# Patient Record
Sex: Male | Born: 1985 | Race: White | Hispanic: No | Marital: Single | Smoking: Current every day smoker
Health system: Southern US, Community
[De-identification: ages and names within clinical notes are randomized; demographics above are authoritative.]

## PROBLEM LIST (undated history)

## (undated) DIAGNOSIS — T07XXXA Unspecified multiple injuries, initial encounter: Secondary | ICD-10-CM

## (undated) DIAGNOSIS — T1490XA Injury, unspecified, initial encounter: Secondary | ICD-10-CM

## (undated) HISTORY — PX: FRACTURE SURGERY: SHX138

---

## 2006-04-21 ENCOUNTER — Emergency Department: Payer: Self-pay | Admitting: General Practice

## 2007-06-18 ENCOUNTER — Inpatient Hospital Stay (HOSPITAL_COMMUNITY): Admission: EM | Admit: 2007-06-18 | Discharge: 2007-06-22 | Payer: Self-pay | Admitting: Emergency Medicine

## 2007-07-17 ENCOUNTER — Emergency Department (HOSPITAL_COMMUNITY): Admission: EM | Admit: 2007-07-17 | Discharge: 2007-07-18 | Payer: Self-pay | Admitting: Emergency Medicine

## 2008-08-28 ENCOUNTER — Emergency Department: Payer: Self-pay | Admitting: Emergency Medicine

## 2008-09-05 ENCOUNTER — Emergency Department: Payer: Self-pay | Admitting: Emergency Medicine

## 2008-10-08 ENCOUNTER — Emergency Department: Payer: Self-pay | Admitting: Emergency Medicine

## 2009-04-12 ENCOUNTER — Emergency Department: Payer: Self-pay | Admitting: Emergency Medicine

## 2009-09-10 ENCOUNTER — Emergency Department: Payer: Self-pay | Admitting: Internal Medicine

## 2009-09-11 ENCOUNTER — Emergency Department: Payer: Self-pay | Admitting: Emergency Medicine

## 2009-09-25 ENCOUNTER — Emergency Department (HOSPITAL_COMMUNITY): Admission: EM | Admit: 2009-09-25 | Discharge: 2009-09-25 | Payer: Self-pay | Admitting: Emergency Medicine

## 2009-09-27 ENCOUNTER — Inpatient Hospital Stay: Payer: Self-pay | Admitting: Psychiatry

## 2009-11-07 ENCOUNTER — Emergency Department (HOSPITAL_COMMUNITY): Admission: EM | Admit: 2009-11-07 | Discharge: 2009-11-07 | Payer: Self-pay | Admitting: Emergency Medicine

## 2009-11-07 ENCOUNTER — Emergency Department: Payer: Self-pay | Admitting: Emergency Medicine

## 2010-02-03 ENCOUNTER — Emergency Department (HOSPITAL_COMMUNITY): Admission: EM | Admit: 2010-02-03 | Discharge: 2010-02-03 | Payer: Self-pay | Admitting: Emergency Medicine

## 2010-06-05 ENCOUNTER — Emergency Department: Payer: Self-pay | Admitting: Emergency Medicine

## 2011-02-01 LAB — DIFFERENTIAL
Basophils Relative: 0 % (ref 0–1)
Eosinophils Absolute: 0.3 10*3/uL (ref 0.0–0.7)
Eosinophils Relative: 5 % (ref 0–5)
Lymphs Abs: 1.4 10*3/uL (ref 0.7–4.0)
Monocytes Relative: 7 % (ref 3–12)
Neutro Abs: 3.8 10*3/uL (ref 1.7–7.7)
Neutrophils Relative %: 63 % (ref 43–77)

## 2011-02-01 LAB — URINALYSIS, ROUTINE W REFLEX MICROSCOPIC
Glucose, UA: NEGATIVE mg/dL
Nitrite: NEGATIVE
Urobilinogen, UA: 1 mg/dL (ref 0.0–1.0)

## 2011-02-01 LAB — POCT I-STAT, CHEM 8
Creatinine, Ser: 1.1 mg/dL (ref 0.4–1.5)
Hemoglobin: 14.6 g/dL (ref 13.0–17.0)
Potassium: 3.7 mEq/L (ref 3.5–5.1)
TCO2: 27 mmol/L (ref 0–100)

## 2011-02-01 LAB — URINE MICROSCOPIC-ADD ON

## 2011-02-01 LAB — CBC: MCV: 87.9 fL (ref 78.0–100.0)

## 2011-02-11 LAB — URINALYSIS, ROUTINE W REFLEX MICROSCOPIC
Bilirubin Urine: NEGATIVE
Glucose, UA: NEGATIVE mg/dL
Ketones, ur: NEGATIVE mg/dL
Nitrite: NEGATIVE
Specific Gravity, Urine: 1.024 (ref 1.005–1.030)
pH: 6 (ref 5.0–8.0)

## 2011-02-11 LAB — URINE MICROSCOPIC-ADD ON
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
## 2011-08-24 LAB — CBC
HCT: 35.1 — ABNORMAL LOW
HCT: 43.9
Hemoglobin: 12.5 — ABNORMAL LOW
Hemoglobin: 12.6 — ABNORMAL LOW
Hemoglobin: 15.2
MCHC: 34.4
MCHC: 35.5
MCV: 85
RBC: 4.13 — ABNORMAL LOW
RBC: 4.19 — ABNORMAL LOW
RBC: 5.07
RDW: 13.3
WBC: 9.6
WBC: 9.8

## 2011-08-24 LAB — I-STAT 8, (EC8 V) (CONVERTED LAB)
Acid-base deficit: 2
Glucose, Bld: 111 — ABNORMAL HIGH
HCT: 47
Hemoglobin: 16
Potassium: 3 — ABNORMAL LOW
Sodium: 138
TCO2: 24
pH, Ven: 7.382 — ABNORMAL HIGH

## 2011-08-24 LAB — BASIC METABOLIC PANEL
CO2: 30
Calcium: 8.6
Chloride: 105
Creatinine, Ser: 0.94
GFR calc Af Amer: >60
Sodium: 140

## 2011-08-24 LAB — PROTIME-INR: Prothrombin Time: 15.8 — ABNORMAL HIGH

## 2011-08-24 LAB — SAMPLE TO BLOOD BANK

## 2014-10-03 ENCOUNTER — Emergency Department (HOSPITAL_COMMUNITY)
Admission: EM | Admit: 2014-10-03 | Discharge: 2014-10-03 | Disposition: A | Discharge status: Home / Self Care | Payer: Self-pay | Attending: Emergency Medicine | Admitting: Emergency Medicine

## 2014-10-03 ENCOUNTER — Emergency Department (HOSPITAL_COMMUNITY): Payer: Self-pay

## 2014-10-03 ENCOUNTER — Encounter (HOSPITAL_COMMUNITY): Payer: Self-pay | Admitting: Cardiology

## 2014-10-03 DIAGNOSIS — Z87891 Personal history of nicotine dependence: Secondary | ICD-10-CM | POA: Insufficient documentation

## 2014-10-03 DIAGNOSIS — T1490XA Injury, unspecified, initial encounter: Secondary | ICD-10-CM

## 2014-10-03 DIAGNOSIS — M455 Ankylosing spondylitis of thoracolumbar region: Secondary | ICD-10-CM | POA: Insufficient documentation

## 2014-10-03 DIAGNOSIS — Z87828 Personal history of other (healed) physical injury and trauma: Secondary | ICD-10-CM | POA: Insufficient documentation

## 2014-10-03 DIAGNOSIS — S22000S Wedge compression fracture of unspecified thoracic vertebra, sequela: Secondary | ICD-10-CM

## 2014-10-03 DIAGNOSIS — Z8781 Personal history of (healed) traumatic fracture: Secondary | ICD-10-CM | POA: Insufficient documentation

## 2014-10-03 DIAGNOSIS — M47819 Spondylosis without myelopathy or radiculopathy, site unspecified: Secondary | ICD-10-CM

## 2014-10-03 DIAGNOSIS — X58XXXS Exposure to other specified factors, sequela: Secondary | ICD-10-CM | POA: Insufficient documentation

## 2014-10-03 DIAGNOSIS — T148XXA Other injury of unspecified body region, initial encounter: Secondary | ICD-10-CM

## 2014-10-03 DIAGNOSIS — Z880 Allergy status to penicillin: Secondary | ICD-10-CM | POA: Insufficient documentation

## 2014-10-03 DIAGNOSIS — M549 Dorsalgia, unspecified: Secondary | ICD-10-CM

## 2014-10-03 DIAGNOSIS — S32010S Wedge compression fracture of first lumbar vertebra, sequela: Secondary | ICD-10-CM | POA: Insufficient documentation

## 2014-10-03 DIAGNOSIS — S22080S Wedge compression fracture of T11-T12 vertebra, sequela: Secondary | ICD-10-CM | POA: Insufficient documentation

## 2014-10-03 HISTORY — DX: Injury, unspecified, initial encounter: T14.90XA

## 2014-10-03 HISTORY — DX: Unspecified multiple injuries, initial encounter: T07.XXXA

## 2014-10-03 MED ORDER — DIAZEPAM 5 MG PO TABS
10.0000 mg | ORAL_TABLET | Freq: Once | ORAL | Status: AC
Start: 2014-10-03 — End: 2014-10-03
  Administered 2014-10-03: 10 mg via ORAL
  Filled 2014-10-03: qty 2

## 2014-10-03 MED ORDER — IBUPROFEN 800 MG PO TABS
800.0000 mg | ORAL_TABLET | Freq: Three times a day (TID) | ORAL | Status: AC
Start: 2014-10-03 — End: ?

## 2014-10-03 MED ORDER — IBUPROFEN 800 MG PO TABS
800.0000 mg | ORAL_TABLET | Freq: Once | ORAL | Status: AC
  Administered 2014-10-03: 800 mg via ORAL
  Filled 2014-10-03: qty 1

## 2014-10-03 MED ORDER — METHOCARBAMOL 500 MG PO TABS: 500.0000 mg | ORAL_TABLET | Freq: Three times a day (TID) | ORAL | Status: AC

## 2014-10-03 NOTE — Discharge Instructions (Signed)
Your x-ray is negative for any new fracture or dislocation. Your previous fractures are seen on today's x-ray, there is also noted multiple areas of arthritis. Please discuss these findings with [facility]. There may be a need for an MRI. Please use ibuprofen and Robaxin 3 times daily for your discomfort. Heating pad to the area may be helpful. Robaxin may cause drowsiness, please use with caution.

## 2014-10-03 NOTE — ED Notes (Signed)
Mid back pain times 3 days.  States it started when he was helping load wood.  States his right hand feels numb.
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
## 2017-07-08 ENCOUNTER — Emergency Department
Admission: EM | Admit: 2017-07-08 | Discharge: 2017-07-08 | Disposition: A | Discharge status: Home / Self Care | Payer: Self-pay | Attending: Emergency Medicine | Admitting: Emergency Medicine

## 2017-07-08 ENCOUNTER — Encounter: Payer: Self-pay | Admitting: Emergency Medicine

## 2017-07-08 ENCOUNTER — Emergency Department: Payer: Self-pay

## 2017-07-08 DIAGNOSIS — Y929 Unspecified place or not applicable: Secondary | ICD-10-CM | POA: Insufficient documentation

## 2017-07-08 DIAGNOSIS — Y999 Unspecified external cause status: Secondary | ICD-10-CM | POA: Insufficient documentation

## 2017-07-08 DIAGNOSIS — R112 Nausea with vomiting, unspecified: Secondary | ICD-10-CM | POA: Insufficient documentation

## 2017-07-08 DIAGNOSIS — S76211A Strain of adductor muscle, fascia and tendon of right thigh, initial encounter: Secondary | ICD-10-CM | POA: Insufficient documentation

## 2017-07-08 DIAGNOSIS — Z87891 Personal history of nicotine dependence: Secondary | ICD-10-CM | POA: Insufficient documentation

## 2017-07-08 DIAGNOSIS — Y939 Activity, unspecified: Secondary | ICD-10-CM | POA: Insufficient documentation

## 2017-07-08 DIAGNOSIS — X509XXA Other and unspecified overexertion or strenuous movements or postures, initial encounter: Secondary | ICD-10-CM | POA: Insufficient documentation

## 2017-07-08 DIAGNOSIS — Z79899 Other long term (current) drug therapy: Secondary | ICD-10-CM | POA: Insufficient documentation

## 2017-07-08 DIAGNOSIS — R109 Unspecified abdominal pain: Secondary | ICD-10-CM | POA: Insufficient documentation

## 2017-07-08 LAB — URINALYSIS, COMPLETE (UACMP) WITH MICROSCOPIC
BILIRUBIN URINE: NEGATIVE
Bacteria, UA: NONE SEEN
GLUCOSE, UA: NEGATIVE mg/dL
KETONES UR: NEGATIVE mg/dL
LEUKOCYTES UA: NEGATIVE
NITRITE: NEGATIVE
PH: 7 (ref 5.0–8.0)
Protein, ur: NEGATIVE mg/dL
SPECIFIC GRAVITY, URINE: 1.017 (ref 1.005–1.030)
WBC, UA: NONE SEEN WBC/hpf (ref 0–5)

## 2017-07-08 MED ORDER — CYCLOBENZAPRINE HCL 10 MG PO TABS: 10.0000 mg | ORAL_TABLET | Freq: Three times a day (TID) | ORAL | 0 refills | Status: AC | PRN

## 2017-07-08 MED ORDER — KETOROLAC TROMETHAMINE 30 MG/ML IJ SOLN
30.0000 mg | Freq: Once | INTRAMUSCULAR | Status: AC
  Administered 2017-07-08: 30 mg via INTRAMUSCULAR
  Filled 2017-07-08: qty 1

## 2017-07-08 MED ORDER — TRAMADOL HCL 50 MG PO TABS: 50.0000 mg | ORAL_TABLET | Freq: Four times a day (QID) | ORAL | 0 refills | Status: AC | PRN

## 2017-07-08 MED ORDER — ONDANSETRON 8 MG PO TBDP
8.0000 mg | ORAL_TABLET | Freq: Once | ORAL | Status: AC
  Administered 2017-07-08: 8 mg via ORAL
  Filled 2017-07-08: qty 1

## 2017-07-08 MED ORDER — IBUPROFEN 800 MG PO TABS: 800.0000 mg | ORAL_TABLET | Freq: Three times a day (TID) | ORAL | 0 refills | Status: AC | PRN

## 2017-07-08 MED ORDER — HYDROMORPHONE HCL 1 MG/ML IJ SOLN
1.0000 mg | Freq: Once | INTRAMUSCULAR | Status: AC
Start: 2017-07-08 — End: 2017-07-08
  Administered 2017-07-08: 1 mg via INTRAMUSCULAR
  Filled 2017-07-08: qty 1

## 2017-07-08 NOTE — ED Provider Notes (Signed)
[facility] Emergency Department Provider Note   ____________________________________________   First MD Initiated Contact with Patient 07/08/17 [phone]     (approximate)  I have reviewed the triage vital signs and the nursing notes.   HISTORY  Chief Complaint Back Pain    HPI [name] is a 31 y.o. male patient complaining of increasing right flank pain for 3 days. Patient stated associated nausea with complaint. Patient denied dysuria or hematuria. Patient also complaining of right inguinal pain that radiates to his scrotum.Patient rates the pain as 8/10. Patient described a pain as "crampy/sharp. No palliative measures for complaint.   Past Medical History:  Diagnosis Date  . Fractures   . Trauma     There are no active problems to display for this patient.   No past surgical history on file.  Prior to Admission medications   Medication Sig Start Date End Date Taking? Authorizing Provider  cyclobenzaprine (FLEXERIL) 10 MG tablet Take 1 tablet (10 mg total) by mouth 3 (three) times daily as needed. 07/08/17   Joni Reining, PA-C  ibuprofen (ADVIL,MOTRIN) 800 MG tablet Take 1 tablet (800 mg total) by mouth 3 (three) times daily. 10/03/14   Ivery Quale, PA-C  ibuprofen (ADVIL,MOTRIN) 800 MG tablet Take 1 tablet (800 mg total) by mouth every 8 (eight) hours as needed for moderate pain. 07/08/17   Joni Reining, PA-C  methocarbamol (ROBAXIN) 500 MG tablet Take 1 tablet (500 mg total) by mouth 3 (three) times daily. 10/03/14   Ivery Quale, PA-C  naproxen sodium (ALEVE) 220 MG tablet Take 440 mg by mouth daily as needed (for pain).    [provider]  traMADol (ULTRAM) 50 MG tablet Take 1 tablet (50 mg total) by mouth every 6 (six) hours as needed for moderate pain. 07/08/17   Joni Reining, PA-C    Allergies Penicillins  No family history on file.  Social History Social History  Substance Use Topics  . Smoking status: Former  Games developer  . Smokeless tobacco: Not on file  . Alcohol use No    Review of Systems Constitutional: No fever/chills Eyes: No visual changes. ENT: No sore throat. Cardiovascular: Denies chest pain. Respiratory: Denies shortness of breath. Gastrointestinal: No abdominal pain.  No nausea, no vomiting.  No diarrhea.  No constipation. Genitourinary: Negative for dysuria. Musculoskeletal: Right flank pain Skin: Negative for rash. Neurological: Negative for headaches, focal weakness or numbness. Allergic/Immunilogical: Penicillin ____________________________________________   PHYSICAL EXAM:  VITAL SIGNS: ED Triage Vitals  Enc Vitals Group     BP 07/08/17 1606 134/76     Pulse Rate 07/08/17 1606 64     Resp 07/08/17 1606 18     Temp 07/08/17 1606 98.6 F (37 C)     Temp Source 07/08/17 1606 Oral     SpO2 07/08/17 1606 100 %     Weight 07/08/17 1605 238 lb (108 kg)     Height 07/08/17 1605 6\' 1"  (1.854 m)     Head Circumference --      Peak Flow --      Pain Score 07/08/17 1605 8     Pain Loc --      Pain Edu? --      Excl. in GC? --     Constitutional: Alert and oriented. Well appearing and in no acute distress. Cardiovascular: Normal rate, regular rhythm. Grossly normal heart sounds.  Good peripheral circulation. Respiratory: Normal respiratory effort.  No retractions. Lungs CTAB. Gastrointestinal: Soft and  nontender. No distention. No abdominal bruits. No CVA tenderness. Musculoskeletal: No lower extremity tenderness nor edema.  No joint effusions. Neurologic:  Normal speech and language. No gross focal neurologic deficits are appreciated. No gait instability. Skin:  Skin is warm, dry and intact. No rash noted. Psychiatric: Mood and affect are normal. Speech and behavior are normal.  ____________________________________________   LABS (all labs ordered are listed, but only abnormal results are displayed)  Labs Reviewed  URINALYSIS, COMPLETE (UACMP) WITH MICROSCOPIC -  Abnormal; Notable for the following:       Result Value   Color, Urine YELLOW (*)    APPearance CLEAR (*)    Hgb urine dipstick MODERATE (*)    Squamous Epithelial / LPF 0-5 (*)    All other components within normal limits   ____________________________________________  EKG   ____________________________________________  RADIOLOGY  Ct Renal Stone Study  Result Date: 07/08/2017 CLINICAL DATA:  Onset of RIGHT sided back pain 3 days ago, difficulty passing urine, pain moving to groin, nausea, history of kidney stones over 10 years ago EXAM: CT ABDOMEN AND PELVIS WITHOUT CONTRAST TECHNIQUE: Multidetector CT imaging of the abdomen and pelvis was performed following the standard protocol without IV contrast. Sagittal and coronal MPR images reconstructed from axial data set. Oral contrast was not administered. COMPARISON:  09/10/2009 CT abdomen and pelvis, lumbar spine radiographs 10/03/2014 FINDINGS: Lower chest: Minimal dependent atelectasis at lung bases Hepatobiliary: Gallbladder and liver normal appearance Pancreas: Normal appearance Spleen: Upper normal size without focal abnormality Adrenals/Urinary Tract: Adrenal glands normal appearance. Kidneys, ureters, and bladder normal appearance. No urinary tract calcification or dilatation. Stomach/Bowel: Normal appearing retrocecal appendix. Stomach and bowel loops grossly normal appearance for technique. Vascular/Lymphatic: Aorta normal caliber.  No adenopathy. Reproductive: Unremarkable prostate gland and seminal vesicles Other: No free air or free fluid. No hernia or inflammatory process. Musculoskeletal: Chronic superior endplate compression fractures of T12 and L1 unchanged. No acute osseous findings. IMPRESSION: No acute intra-abdominal or intrapelvic abnormalities. 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 Electronically Signed   By: Ulyses SouthwardMark  Boles M.D.   On: 07/08/2017 17:50     ____________________________________________   PROCEDURES  Procedure(s) performed: None  Procedures  Critical Care performed: No  ____________________________________________   INITIAL IMPRESSION / ASSESSMENT AND PLAN / ED COURSE  Pertinent labs & imaging results that were available during my care of the patient were reviewed by me and considered in my medical decision making (see chart for details). Right flank and right inguinal pain. Patient present with right flank and inguinal pain for 3 days. Patient's abdominal and pelvic x-ray were unremarkable. Patient given discharge care instruction advised to take medication as directed. Patient given a work note and [facility] if complaint persists.      ____________________________________________   FINAL CLINICAL IMPRESSION(S) / ED DIAGNOSES  Final diagnoses:  Right flank pain  Inguinal strain, right, initial encounter      NEW MEDICATIONS STARTED DURING THIS VISIT:  Discharge Medication List as of 07/08/2017  6:09 PM    START taking these medications   Details  cyclobenzaprine (FLEXERIL) 10 MG tablet Take 1 tablet (10 mg total) by mouth 3 (three) times daily as needed., Starting Thu 07/08/2017, Print    !! ibuprofen (ADVIL,MOTRIN) 800 MG tablet Take 1 tablet (800 mg total) by mouth every 8 (eight) hours as needed for moderate pain., Starting Thu 07/08/2017, Print    traMADol (ULTRAM) 50 MG tablet Take 1 tablet (  50 mg total) by mouth every 6 (six) hours as needed for moderate pain., Starting Thu 07/08/2017, Print     !! - Potential duplicate medications found. Please discuss with provider.       Note:  This document was prepared using Dragon voice recognition software and may include unintentional dictation errors.    Joni Reining, PA-C 07/08/17 2133    Nita Sickle, MD 07/09/17 [phone]

## 2017-07-08 NOTE — ED Triage Notes (Signed)
Pt reports low back pain for three days. Pt reports some associated nausea but denies other symptoms. Pt ambulatory to triage. No apparent distress noted.

## 2017-07-08 NOTE — ED Notes (Addendum)
See triage note  States he developed right sided back pain about 3 days ago  Then also noticed some diff passing urine and pain is moving into groin area

## 2017-07-08 NOTE — Discharge Instructions (Signed)
Take medication as directed. Follow[facility] condition persists.

## 2018-04-08 IMAGING — CT CT RENAL STONE PROTOCOL
2 of 4 series · 16 of 46 positions shown, 18 images · non-contrast
Comparison: 09/10/2009 CT abdomen and pelvis, lumbar spine
radiographs 10/03/2014

CLINICAL DATA: Onset of RIGHT sided back pain 3 days ago,
difficulty passing urine, pain moving to groin, nausea, history of
kidney stones over 10 years ago

EXAM:
CT ABDOMEN AND PELVIS WITHOUT CONTRAST
TECHNIQUE: Multidetector CT imaging of the abdomen and pelvis was performed
following the standard protocol without IV contrast. Sagittal and
coronal MPR images reconstructed from axial data set. Oral contrast
was not administered.

[Series 2: stone full standard · axial · 0.84mm/px · z∈[-1120,-620]mm · 13 of 110 slices shown, 15 images]
[im 5/110  soft-tissue]
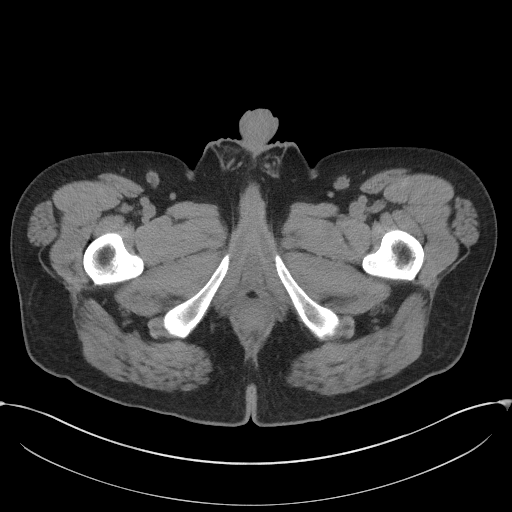
[im 5/110  bone]
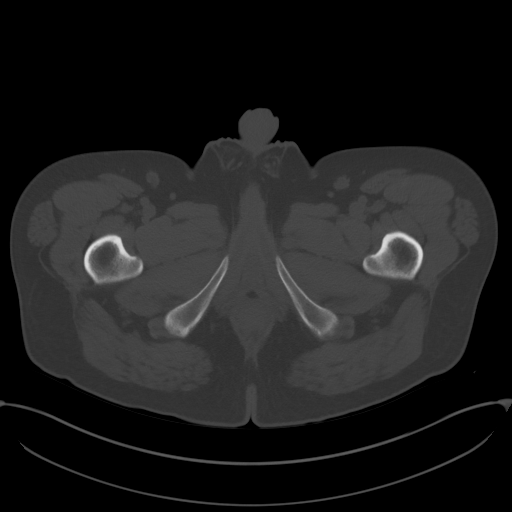
[im 14/110  soft-tissue]
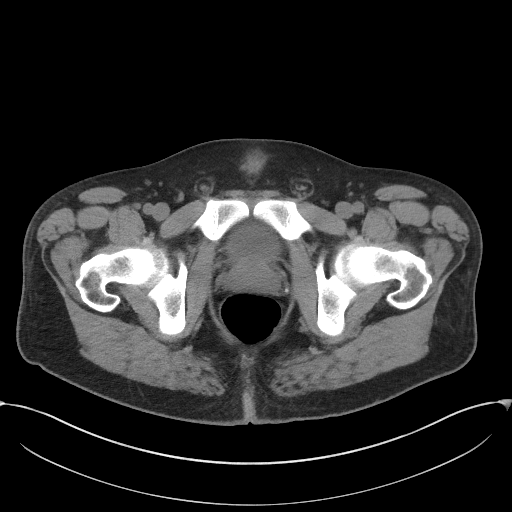
[im 22/110  soft-tissue]
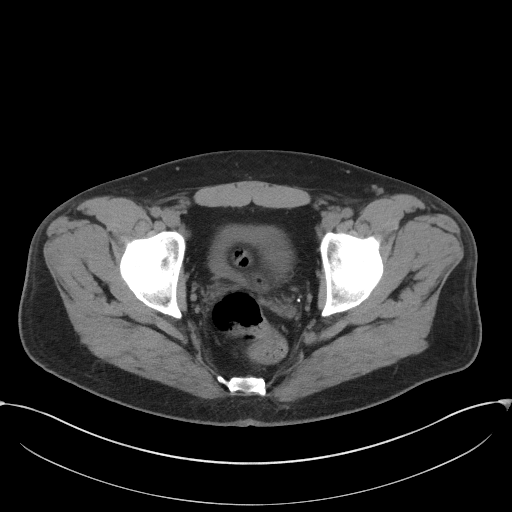
[im 31/110  soft-tissue]
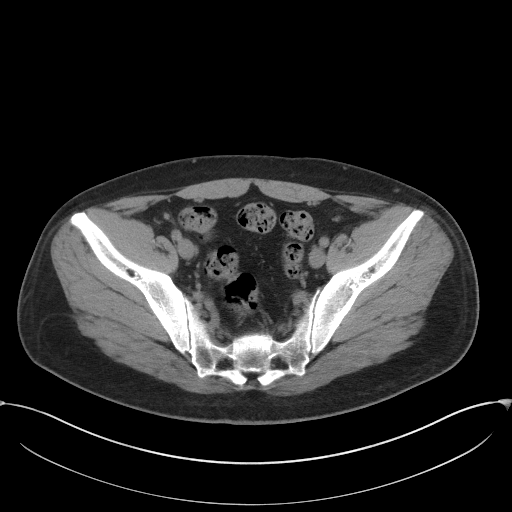
[im 40/110  soft-tissue]
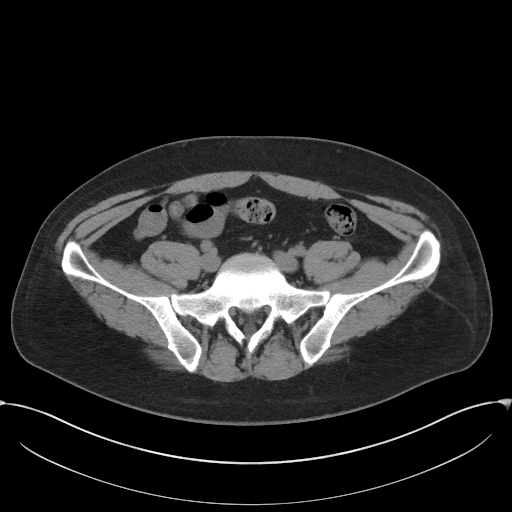
[im 48/110  soft-tissue]
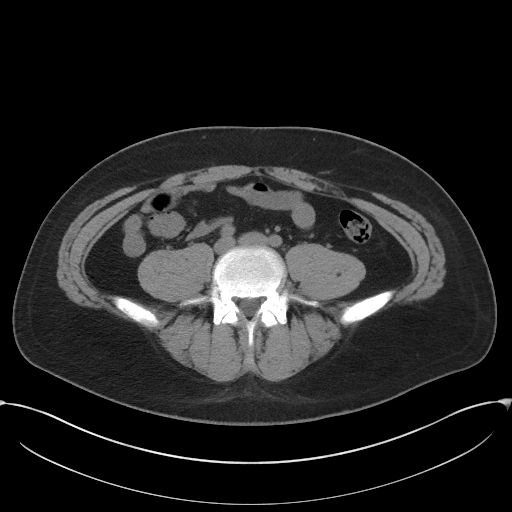
[im 57/110  soft-tissue]
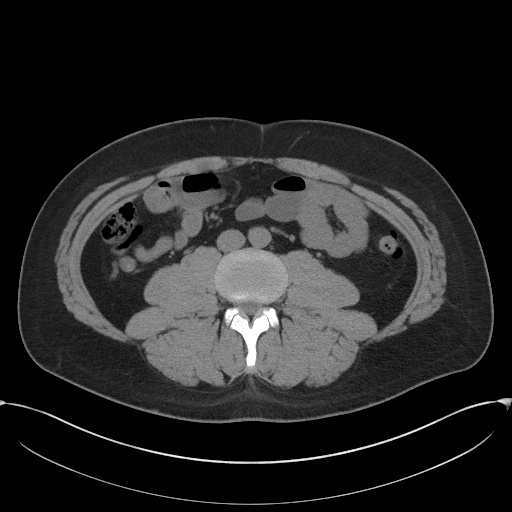
[im 62/110  soft-tissue]
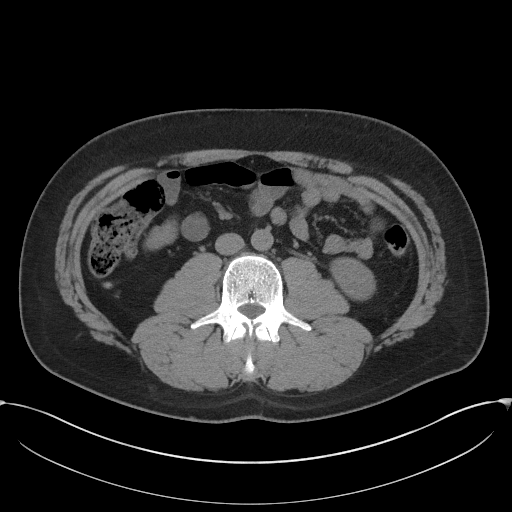
[im 70/110  soft-tissue]
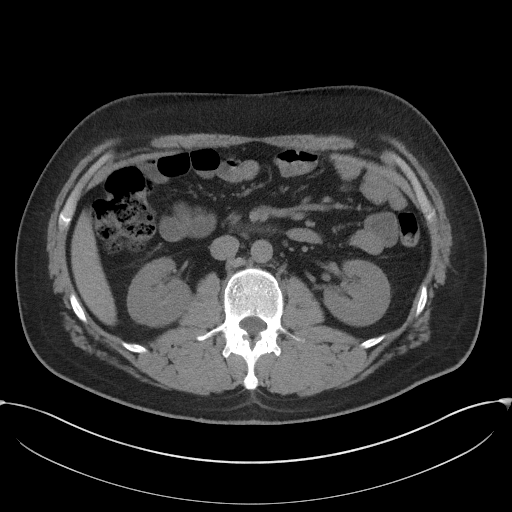
[im 70/110  bone]
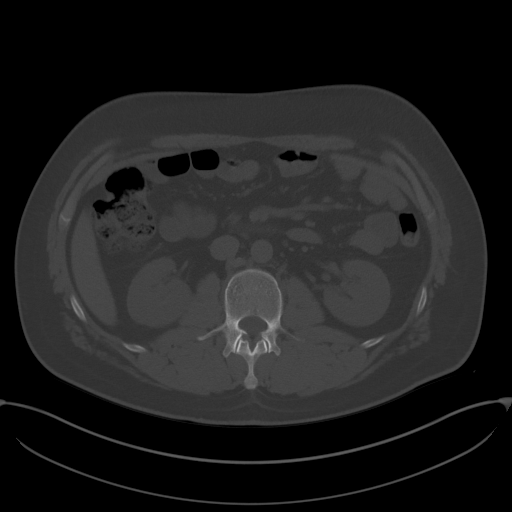
[im 79/110  soft-tissue]
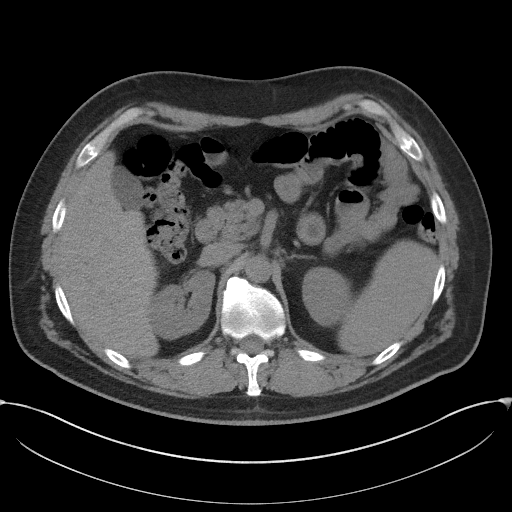
[im 88/110  soft-tissue]
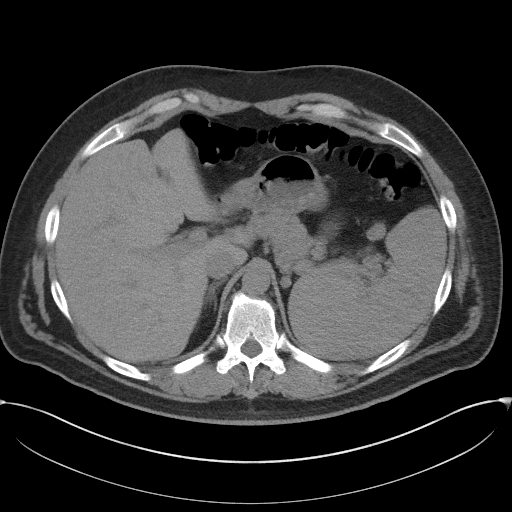
[im 96/110  soft-tissue]
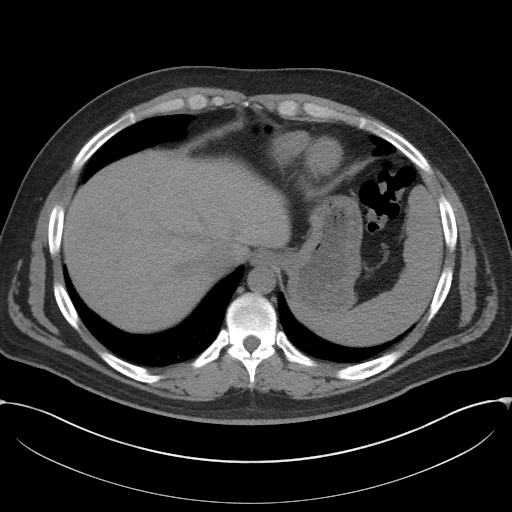
[im 105/110  soft-tissue]
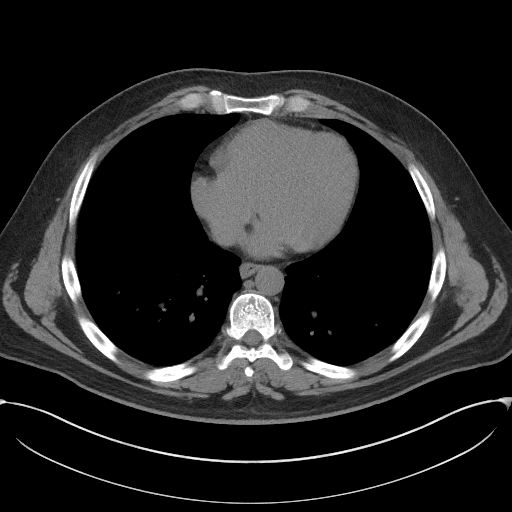

[Series 5: coronal · coronal · 0.92mm/px · 3 of 135 slices shown]
[im 45/135  soft-tissue]
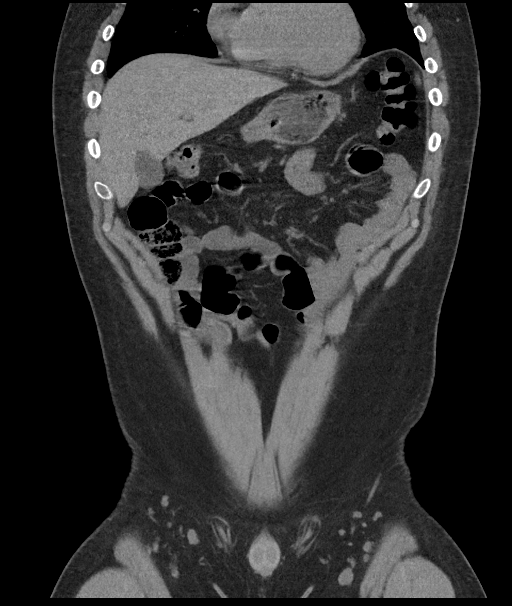
[im 60/135  soft-tissue]
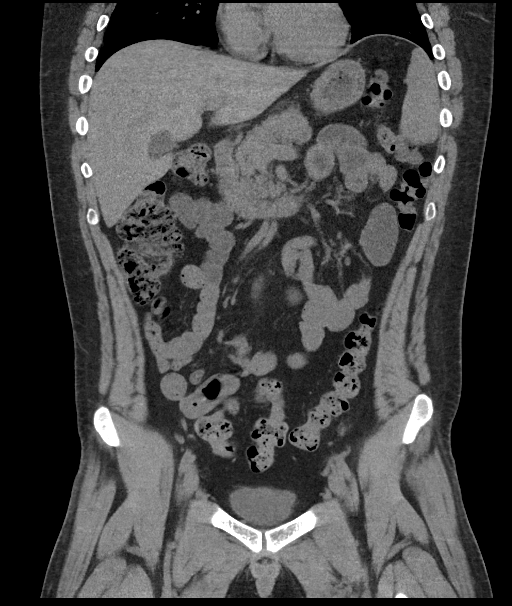
[im 75/135  soft-tissue]
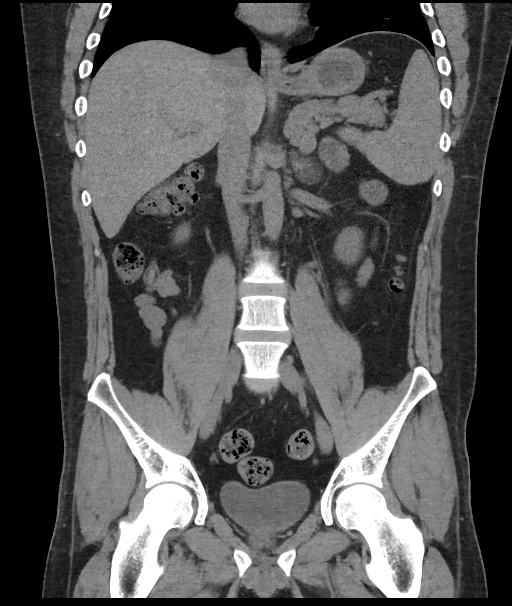

[16 of 46 positions shown; findings below may reference images not displayed]

FINDINGS: Lower chest: Minimal dependent atelectasis at lung bases

Hepatobiliary: Gallbladder and liver normal appearance

Pancreas: Normal appearance

Spleen: Upper normal size without focal abnormality

Adrenals/Urinary Tract: Adrenal glands normal appearance. Kidneys,
ureters, and bladder normal appearance. No urinary tract
calcification or dilatation.

Stomach/Bowel: Normal appearing retrocecal appendix. Stomach and
bowel loops grossly normal appearance for technique.

Vascular/Lymphatic: Aorta normal caliber.  No adenopathy.

Reproductive: Unremarkable prostate gland and seminal vesicles

Other: No free air or free fluid. No hernia or inflammatory process.

Musculoskeletal: Chronic superior endplate compression fractures of
T12 and L1 unchanged. No acute osseous findings.
IMPRESSION: No acute intra-abdominal or intrapelvic abnormalities.

Chronic superior endplate compression deformities of T12 and L1
vertebral bodies.

## 2019-02-18 ENCOUNTER — Emergency Department (HOSPITAL_COMMUNITY)
Admission: EM | Admit: 2019-02-18 | Discharge: 2019-02-19 | Disposition: A | Discharge status: Home / Self Care | Payer: Self-pay | Attending: Emergency Medicine | Admitting: Emergency Medicine

## 2019-02-18 ENCOUNTER — Other Ambulatory Visit: Payer: Self-pay

## 2019-02-18 ENCOUNTER — Encounter (HOSPITAL_COMMUNITY): Payer: Self-pay | Admitting: Emergency Medicine

## 2019-02-18 ENCOUNTER — Emergency Department (HOSPITAL_COMMUNITY): Payer: Self-pay

## 2019-02-18 DIAGNOSIS — F17210 Nicotine dependence, cigarettes, uncomplicated: Secondary | ICD-10-CM | POA: Insufficient documentation

## 2019-02-18 DIAGNOSIS — L03811 Cellulitis of head [any part, except face]: Secondary | ICD-10-CM | POA: Insufficient documentation

## 2019-02-18 DIAGNOSIS — R55 Syncope and collapse: Secondary | ICD-10-CM | POA: Insufficient documentation

## 2019-02-18 DIAGNOSIS — Z880 Allergy status to penicillin: Secondary | ICD-10-CM | POA: Insufficient documentation

## 2019-02-18 MED ORDER — IOHEXOL 300 MG/ML  SOLN
75.0000 mL | Freq: Once | INTRAMUSCULAR | Status: AC | PRN
  Administered 2019-02-19: 01:00:00 75 mL via INTRAVENOUS

## 2019-02-18 MED ORDER — SODIUM CHLORIDE 0.9 % IV BOLUS
1000.0000 mL | Freq: Once | INTRAVENOUS | Status: AC
  Administered 2019-02-18: 1000 mL via INTRAVENOUS

## 2019-02-18 MED ORDER — LIDOCAINE-EPINEPHRINE (PF) 1 %-1:200000 IJ SOLN
INTRAMUSCULAR | Status: AC
  Filled 2019-02-18: qty 30

## 2019-02-18 MED ORDER — LIDOCAINE-EPINEPHRINE (PF) 1 %-1:200000 IJ SOLN
10.0000 mL | Freq: Once | INTRAMUSCULAR | Status: AC
  Administered 2019-02-18: 10 mL via INTRADERMAL
  Filled 2019-02-18: qty 30

## 2019-02-18 NOTE — ED Provider Notes (Signed)
[facility] EMERGENCY DEPARTMENT Provider Note   CSN: [id number] Arrival date & time: 02/18/19  2218    History   Chief Complaint Chief Complaint  Patient presents with  . Abscess  . Near Syncope    after "three or four shots and maybe a beer too"    HPI [name] is a 33 y.o. male.     HPI   [name] is a 33 y.o. male who presents to the Emergency Department complaining of pain, swelling and possible abscess to his right posterior neck.  He states that he got a haircut and the lower portion of his hairline shaved when he noticed a "hairbump" a couple of days later.  He has squeezed the area to try to express pus and now has increased pain to the area with swelling and surrounding redness.  He also endorses generalized malaise since onset and he is concerned that " the infection has gotten into my bloodstream."  Around 10 pm this evening he was sitting in a chair texting and "blacked out for a few seconds" he states that he fell down a few steps landing on his shoulder, denies injuries related to the fall. He admits to drinking one beer and several "shots" of liquor tonight and smoked some marijuana, but states that he drinks alcohol frequently and he does not feel that he passed out secondary to the alcohol. He recalls crawling up the steps and into the house.  He also denies vomiting, headache, chest or rib pain, visual changes and shortness of breath.      Past Medical History:  Diagnosis Date  . Fractures   . Trauma     There are no active problems to display for this patient.   Past Surgical History:  Procedure Laterality Date  . FRACTURE SURGERY        Home Medications    Prior to Admission medications   Medication Sig Start Date End Date Taking? Authorizing Provider  cyclobenzaprine (FLEXERIL) 10 MG tablet Take 1 tablet (10 mg total) by mouth 3 (three) times daily as needed. 07/08/17   Joni Reining, PA-C  ibuprofen (ADVIL,MOTRIN) 800 MG tablet Take 1  tablet (800 mg total) by mouth 3 (three) times daily. 10/03/14   Ivery Quale, PA-C  ibuprofen (ADVIL,MOTRIN) 800 MG tablet Take 1 tablet (800 mg total) by mouth every 8 (eight) hours as needed for moderate pain. 07/08/17   Joni Reining, PA-C  methocarbamol (ROBAXIN) 500 MG tablet Take 1 tablet (500 mg total) by mouth 3 (three) times daily. 10/03/14   Ivery Quale, PA-C  naproxen sodium (ALEVE) 220 MG tablet Take 440 mg by mouth daily as needed (for pain).    [provider]  traMADol (ULTRAM) 50 MG tablet Take 1 tablet (50 mg total) by mouth every 6 (six) hours as needed for moderate pain. 07/08/17   Joni Reining, PA-C    Family History No family history on file.  Social History Social History   Tobacco Use  . Smoking status: Current Every Day Smoker    Packs/day: 1.00    Types: Cigarettes  . Smokeless tobacco: Former Engineer, water Use Topics  . Alcohol use: No  . Drug use: No     Allergies   Penicillins   Review of Systems Review of Systems  Constitutional: Negative for chills and fever.  Eyes: Negative for visual disturbance.  Respiratory: Negative for shortness of breath.   Cardiovascular: Negative for chest pain.  Gastrointestinal: Negative  for abdominal pain, nausea and vomiting.  Musculoskeletal: Positive for neck pain. Negative for arthralgias and joint swelling.  Skin: Positive for color change.       Pain, swelling to right neck  Neurological: Positive for syncope. Negative for dizziness, seizures and headaches.  Hematological: Negative for adenopathy.  Psychiatric/Behavioral: Negative for confusion.     Physical Exam Updated Vital Signs BP 100/79 (BP Location: Left Arm)   Pulse (!) 116   Temp 98.2 F (36.8 C) (Oral)   Resp 16   Ht 6\' 1"  (1.854 m)   Wt 111.1 kg   SpO2 100%   BMI 32.32 kg/m   Physical Exam Vitals signs and nursing note reviewed.  Constitutional:      Appearance: Normal appearance. He is not toxic-appearing.   HENT:     Right Ear: Tympanic membrane and ear canal normal.     Left Ear: Tympanic membrane and ear canal normal.     Mouth/Throat:     Mouth: Mucous membranes are moist.     Pharynx: Oropharynx is clear.  Eyes:     Extraocular Movements: Extraocular movements intact.     Conjunctiva/sclera: Conjunctivae normal.     Pupils: Pupils are equal, round, and reactive to light.  Neck:     Comments: 10 cm focal area of induration and mild erythema of the lateral right neck, scant amt of purulent drainage present. No significant fluctuance noted.  Cardiovascular:     Rate and Rhythm: Normal rate and regular rhythm.     Pulses: Normal pulses.  Pulmonary:     Effort: Pulmonary effort is normal.  Chest:     Chest wall: No tenderness.  Musculoskeletal: Normal range of motion.  Skin:    General: Skin is warm.  Neurological:     General: No focal deficit present.     Mental Status: He is alert.     GCS: GCS eye subscore is 4. GCS verbal subscore is 5. GCS motor subscore is 6.     Sensory: Sensation is intact. No sensory deficit.     Motor: Motor function is intact. No weakness.     Coordination: Coordination is intact.     Comments: Speech clear, CN II-XII intact      ED Treatments / Results  Labs (all labs ordered are listed, but only abnormal results are displayed) Labs Reviewed  CBC WITH DIFFERENTIAL/PLATELET - Abnormal; Notable for the following components:      Result Value   WBC 11.6 (*)    Neutro Abs 9.5 (*)    All other components within normal limits  BASIC METABOLIC PANEL - Abnormal; Notable for the following components:   Glucose, Bld 109 (*)    Calcium 8.8 (*)    All other components within normal limits  ETHANOL  URINALYSIS, ROUTINE W REFLEX MICROSCOPIC  RAPID URINE DRUG SCREEN, HOSP PERFORMED    EKG None  Radiology No results found.  Procedures Procedures (including critical care time)  INCISION AND DRAINAGE Performed by: Hlee Fringer Consent: Verbal  consent obtained. Risks and benefits: risks, benefits and alternatives were discussed Type: abscess  Body area: right neck  Anesthesia: local infiltration  Incision was made with a #11 scalpel.  Local anesthetic: lidocaine 1% w/epinephrine  Anesthetic total:  3 ml  Complexity: complex Blunt dissection to break up loculations  Drainage: purulent  Drainage amount: small  Packing material: none  Patient tolerance: Patient tolerated the procedure well with no immediate complications.     Medications Ordered in  ED Medications - No data to display   Initial Impression / Assessment and Plan / ED Course  I have reviewed the triage vital signs and the nursing notes.  Pertinent labs & imaging results that were available during my care of the patient were reviewed by me and considered in my medical decision making (see chart for details).        Pt also seen by Dr. Adriana Simasook and care plan discussed.    Patient with probable abscess to the skin of lateral right neck, he is well appearing, non-toxic and alert. No focal neuro deficits. He does not appear intoxicated.  Somewhat successful I&D with small amt of purulent drainage.  I feel that most of the induration is secondary to trauma, but due to location a CT soft tissue neck was ordered.     0040 I have discussed findings with Dr. Eber HongBrian Miller who assumes care of the patient as CT results are still pending.    Final Clinical Impressions(s) / ED Diagnoses   Final diagnoses:  None    ED Discharge Orders    None       Rosey Bathriplett, Kenya Kook, PA-C 02/19/19 0045    Eber HongMiller, Brian, MD 02/19/19 [phone]

## 2019-02-18 NOTE — ED Triage Notes (Signed)
Pt reports abscess to his R posterior necktried to pop it and now it is much worse  Also reports "fell out" after drinking several shots of liquor as well as 1 beer

## 2019-02-19 LAB — CBC WITH DIFFERENTIAL/PLATELET
Abs Immature Granulocytes: 0.04 10*3/uL (ref 0.00–0.07)
Basophils Absolute: 0 10*3/uL (ref 0.0–0.1)
Basophils Relative: 0 %
Eosinophils Absolute: 0.2 10*3/uL (ref 0.0–0.5)
Eosinophils Relative: 2 %
HCT: 41.6 % (ref 39.0–52.0)
Hemoglobin: 13.8 g/dL (ref 13.0–17.0)
Immature Granulocytes: 0 %
Lymphocytes Relative: 8 %
Lymphs Abs: 0.9 10*3/uL (ref 0.7–4.0)
MCH: 29.4 pg (ref 26.0–34.0)
MCHC: 33.2 g/dL (ref 30.0–36.0)
MCV: 88.7 fL (ref 80.0–100.0)
Monocytes Absolute: 0.9 10*3/uL (ref 0.1–1.0)
Monocytes Relative: 8 %
Neutro Abs: 9.5 10*3/uL — ABNORMAL HIGH (ref 1.7–7.7)
Neutrophils Relative %: 82 %
Platelets: 290 10*3/uL (ref 150–400)
RBC: 4.69 MIL/uL (ref 4.22–5.81)
RDW: 13.1 % (ref 11.5–15.5)
WBC: 11.6 10*3/uL — ABNORMAL HIGH (ref 4.0–10.5)
nRBC: 0 % (ref 0.0–0.2)

## 2019-02-19 LAB — BASIC METABOLIC PANEL
Anion gap: 9 (ref 5–15)
BUN: 9 mg/dL (ref 6–20)
CO2: 26 mmol/L (ref 22–32)
Calcium: 8.8 mg/dL — ABNORMAL LOW (ref 8.9–10.3)
Chloride: 102 mmol/L (ref 98–111)
Creatinine, Ser: 0.92 mg/dL (ref 0.61–1.24)
GFR calc Af Amer: >60 mL/min (ref >60)
GFR calc non Af Amer: >60 mL/min (ref >60)
Glucose, Bld: 109 mg/dL — ABNORMAL HIGH (ref 70–99)
Potassium: 4.3 mmol/L (ref 3.5–5.1)
Sodium: 137 mmol/L (ref 135–145)

## 2019-02-19 LAB — ETHANOL: Alcohol, Ethyl (B): <10 mg/dL (ref <10)

## 2019-02-19 MED ORDER — CLINDAMYCIN PHOSPHATE 600 MG/50ML IV SOLN
600.0000 mg | Freq: Once | INTRAVENOUS | Status: AC
  Administered 2019-02-19: 600 mg via INTRAVENOUS
  Filled 2019-02-19: qty 50

## 2019-02-19 MED ORDER — CLINDAMYCIN HCL 300 MG PO CAPS: 300.0000 mg | ORAL_CAPSULE | Freq: Four times a day (QID) | ORAL | 0 refills | Status: AC

## 2019-02-19 MED ORDER — HYDROCODONE-ACETAMINOPHEN 5-325 MG PO TABS: 1.0000 | ORAL_TABLET | Freq: Four times a day (QID) | ORAL | 0 refills | Status: AC | PRN

## 2019-02-19 MED ORDER — OXYCODONE-ACETAMINOPHEN 5-325 MG PO TABS
1.0000 | ORAL_TABLET | Freq: Once | ORAL | Status: AC
  Administered 2019-02-19: 01:00:00 1 via ORAL
  Filled 2019-02-19: qty 1

## 2019-02-19 NOTE — Discharge Instructions (Signed)
Your testing today showed that your scalp has an infection, please stop squeezing this as it can make it worse.  You are to use warm compresses or warm soaks twice a day and take clindamycin, 1 tablet every 6 hours for the next 10 days.  It is very important that you take all of this medication.  If you miss a dose please do not take 2 tablets at your next dose but just continue on at the next available time.  Seek medical exam for increasing pain swelling fever drainage or redness.  You should have your doctor recheck you within 48 hours.

## 2019-02-19 NOTE — ED Notes (Signed)
Patient transported to CT 

## 2019-11-20 IMAGING — CT CT NECK WITH CONTRAST
3 of 4 series · 13 of 33 positions shown, 16 images · IV contrast (omnipaque)
Comparison: None.

CLINICAL DATA: Initial evaluation for skin abscess.

EXAM:
CT NECK WITH CONTRAST
TECHNIQUE: Multidetector CT imaging of the neck was performed using the
standard protocol following the bolus administration of intravenous
contrast.
CONTRAST:  75mL OMNIPAQUE IOHEXOL 300 MG/ML  SOLN

[Series 2: axial neck · axial · 0.50mm/px · z∈[-108,+46]mm · 5 of 117 slices shown, 7 images]
[im 20/117  soft-tissue]
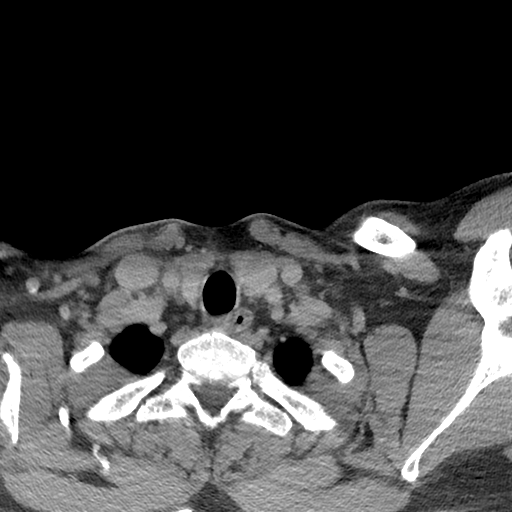
[im 20/117  bone]
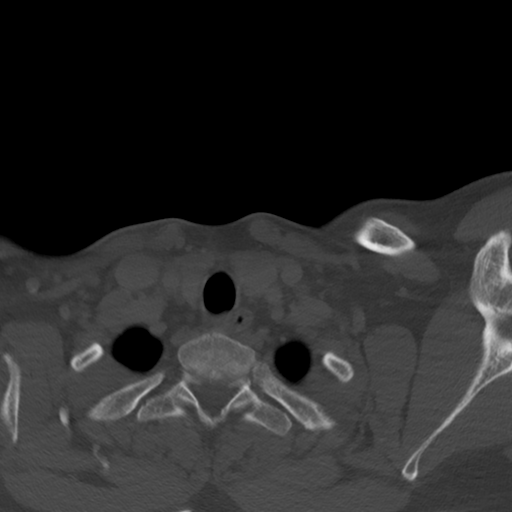
[im 39/117  bone]
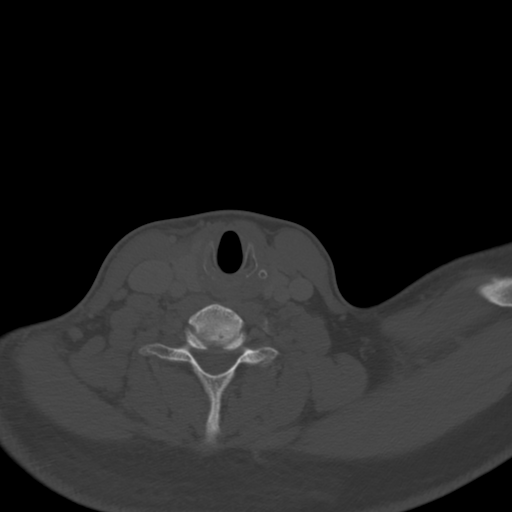
[im 59/117  bone]
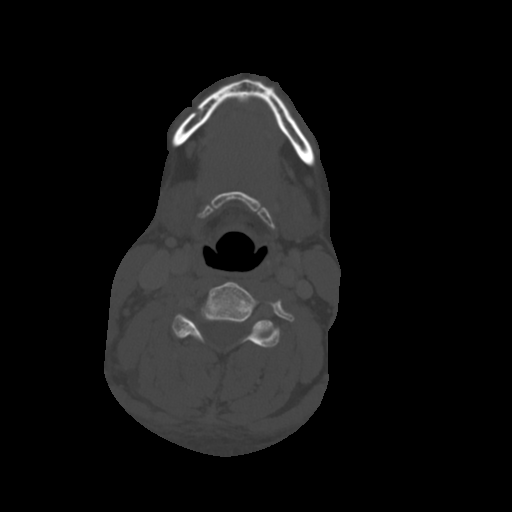
[im 78/117  bone]
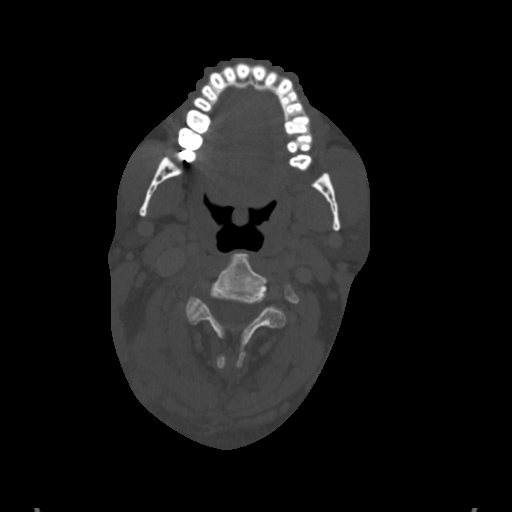
[im 97/117  soft-tissue]
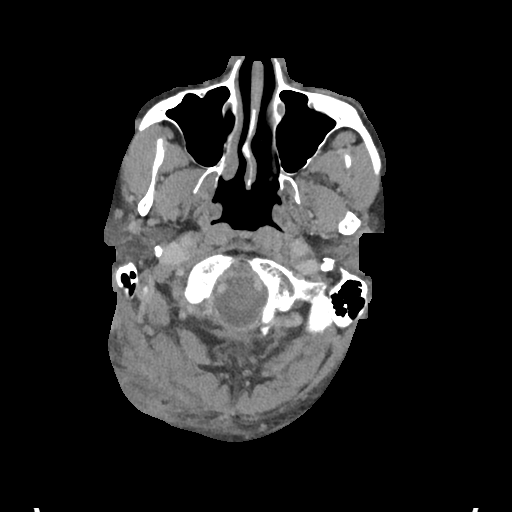
[im 97/117  bone]
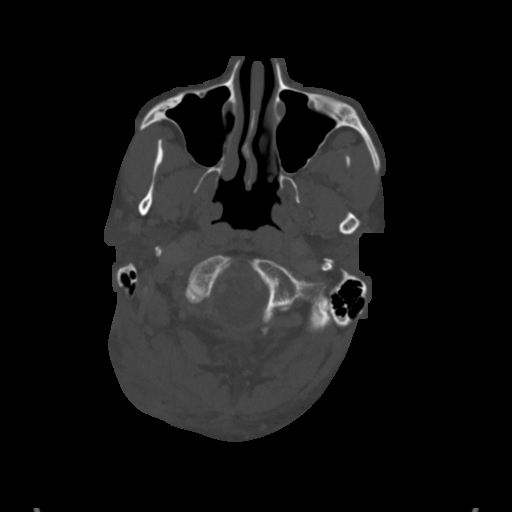

[Series 8: sagittal neck · sagittal · 0.50mm/px · 5 of 90 slices shown, 6 images]
[im 30/90  bone]
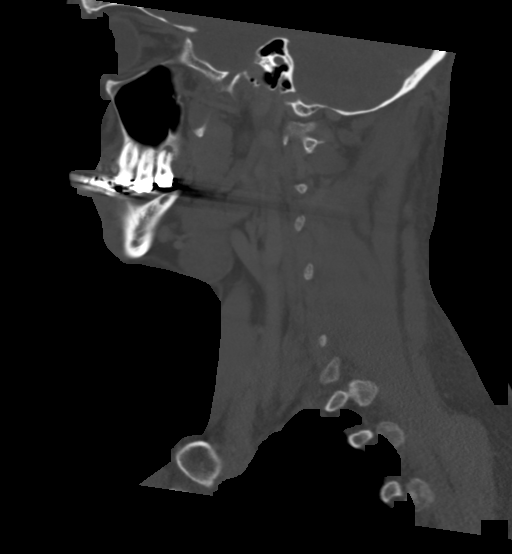
[im 38/90  bone]
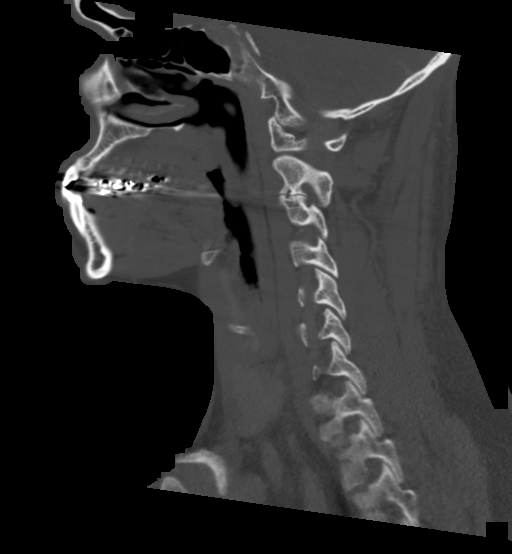
[im 45/90  soft-tissue]
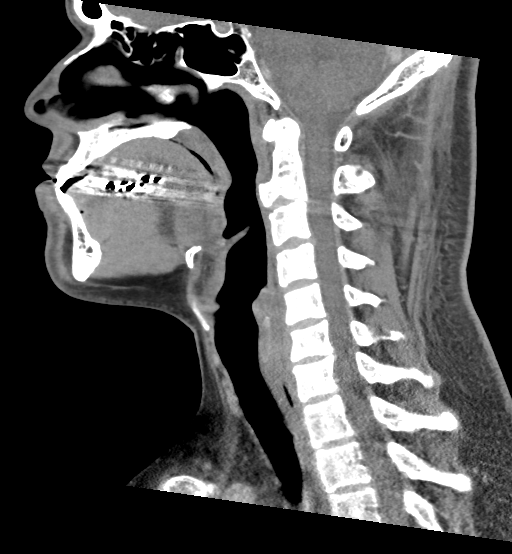
[im 45/90  bone]
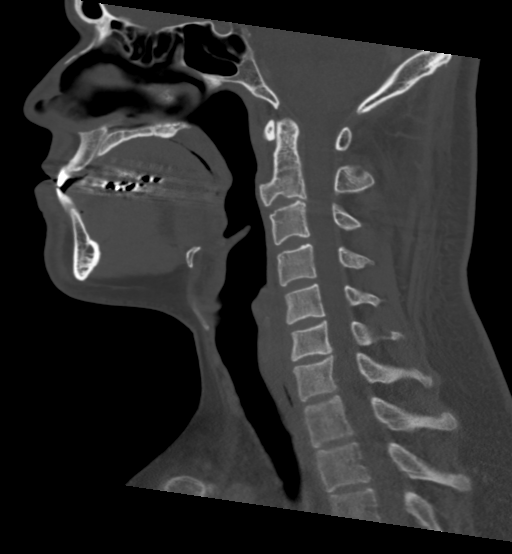
[im 52/90  bone]
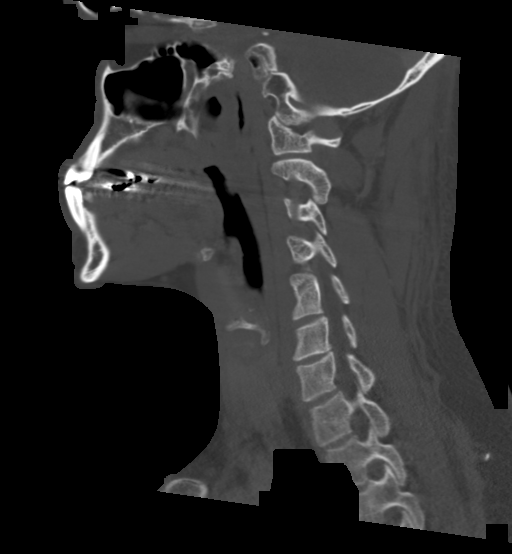
[im 60/90  bone]
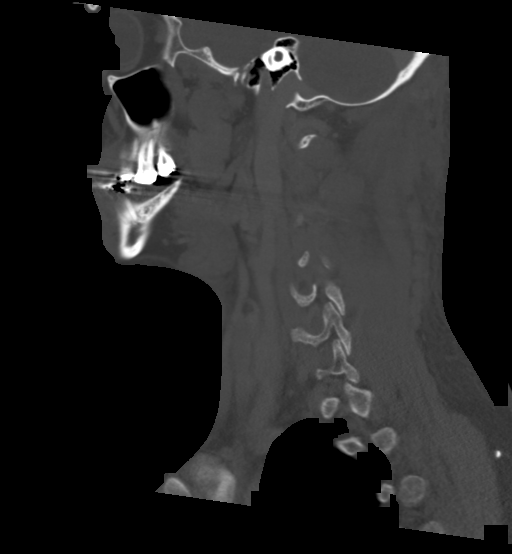

[Series 10: coronal neck · coronal · 0.41mm/px · 3 of 137 slices shown]
[im 28/137  bone]
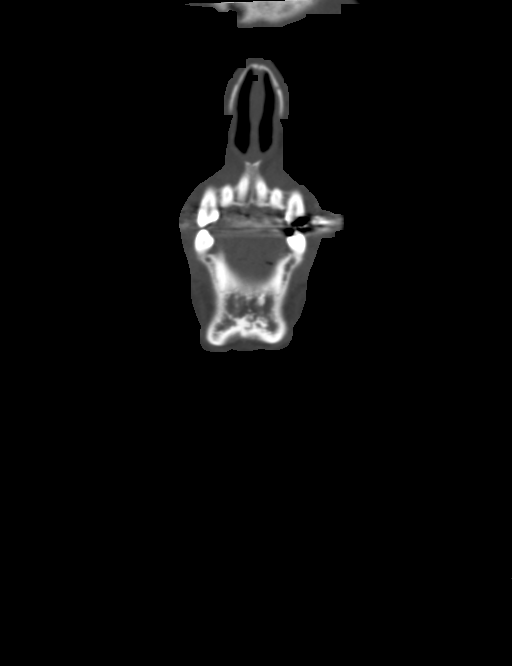
[im 55/137  bone]
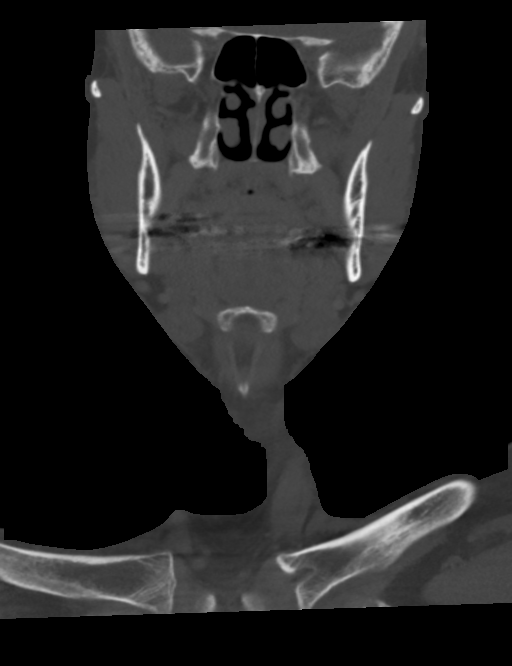
[im 82/137  bone]
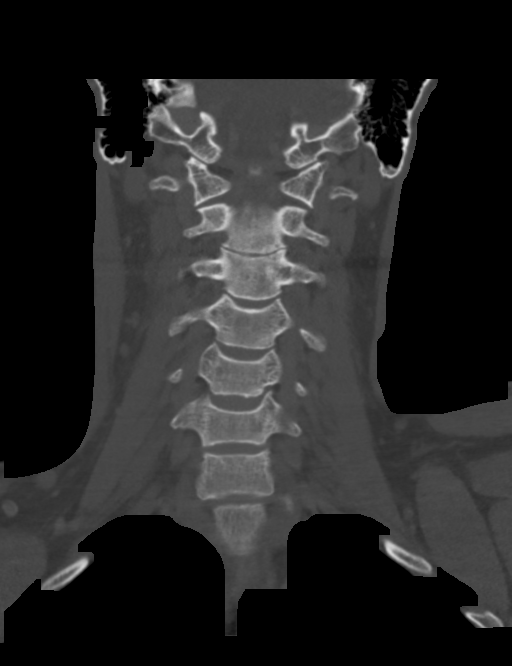

[13 of 33 positions shown; findings below may reference images not displayed]

FINDINGS: Pharynx and larynx: Oral cavity within normal limits. No acute
abnormality about the dentition. Oropharynx and nasopharynx within
normal limits. No retropharyngeal collection. Epiglottis normal.
Vallecula clear. Remainder of the hypopharynx and supraglottic
larynx normal. True cords symmetric and normal. Subglottic airway
clear.

Salivary glands: Salivary glands including the parotid and
submandibular glands within normal limits.

Thyroid: Thyroid normal.

Lymph nodes: Prominent right posterior level V and supraclavicular
lymph nodes measure up to 8 mm in short axis, likely reactive. No
other pathologically enlarged lymph nodes within the neck.

Vascular: Normal intravascular enhancement seen throughout the neck.

Limited intracranial: Unremarkable.

Visualized orbits: Visualized globes and orbital soft tissues within
normal limits.

Mastoids and visualized paranasal sinuses: Visualized paranasal
sinuses are clear. Mastoid air cells and middle ear cavities well
pneumatized and free of fluid.

Skeleton: No acute osseous finding. No discrete lytic or blastic
osseous lesions.

Upper chest: Visualized upper chest demonstrates no acute finding.
Visualized lungs are clear.

Other: Extensive soft tissue swelling with inflammatory stranding
within the right posterior neck, compatible with acute
infection/cellulitis (series 2, image 29). Associated phlegmon
throughout the subcutaneous fat within this region without discrete
abscess or drainable fluid collection. Inflammatory changes extend
into the underlying right posterior paraspinous musculature. No
extension into the underlying spinal canal. Overlying skin
thickening with some focal area of focal skin breakdown noted
(series 2, image 33), likely a draining sinus tract. Edema and
induration extends inferiorly within the posterior neck, and
anteriorly around the right lateral neck towards the right submental
region. Reactive adenopathy is above.
IMPRESSION: 1. Extensive soft tissue swelling with inflammatory stranding within
the right posterior neck, compatible with acute
infection/cellulitis. Overlying focal area of skin breakdown, likely
a draining sinus tract. No discrete abscess or drainable fluid
collection identified.
2. Mildly prominent right posterior level V and supraclavicular
lymph nodes, likely reactive.
